# Patient Record
Sex: Female | Born: 1995 | Race: White | Hispanic: No | Smoking: Former smoker
Health system: Southern US, Community
[De-identification: ages and names within clinical notes are randomized; demographics above are authoritative.]

## PROBLEM LIST (undated history)

## (undated) DIAGNOSIS — F419 Anxiety disorder, unspecified: Secondary | ICD-10-CM

## (undated) DIAGNOSIS — Z789 Other specified health status: Secondary | ICD-10-CM

## (undated) HISTORY — PX: TONSILLECTOMY: SUR1361

## (undated) HISTORY — DX: Other specified health status: Z78.9

---
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
## 2013-05-19 ENCOUNTER — Ambulatory Visit: Payer: Self-pay

## 2013-05-19 LAB — URINALYSIS, COMPLETE
Bilirubin,UR: NEGATIVE
Glucose,UR: NEGATIVE mg/dL (ref 0–75)
Ketone: NEGATIVE
Nitrite: NEGATIVE
Ph: 7 (ref 4.5–8.0)
Protein: 30
RBC,UR: >30 /HPF (ref 0–5)
Specific Gravity: 1.015 (ref 1.003–1.030)
WBC UR: >30 /HPF (ref 0–5)

## 2013-05-21 LAB — URINE CULTURE

## 2013-09-07 ENCOUNTER — Emergency Department: Payer: Self-pay | Admitting: Emergency Medicine

## 2013-09-07 LAB — DRUG SCREEN, URINE
Amphetamines, Ur Screen: NEGATIVE (ref ?–1000)
Barbiturates, Ur Screen: NEGATIVE (ref ?–200)
Benzodiazepine, Ur Scrn: NEGATIVE (ref ?–200)
Cannabinoid 50 Ng, Ur ~~LOC~~: POSITIVE (ref ?–50)
Cocaine Metabolite,Ur ~~LOC~~: NEGATIVE (ref ?–300)
MDMA (Ecstasy)Ur Screen: NEGATIVE (ref ?–500)
Methadone, Ur Screen: NEGATIVE (ref ?–300)
Opiate, Ur Screen: NEGATIVE (ref ?–300)
Phencyclidine (PCP) Ur S: NEGATIVE (ref ?–25)
Tricyclic, Ur Screen: NEGATIVE (ref ?–1000)

## 2013-09-07 LAB — BASIC METABOLIC PANEL
Anion Gap: 9 (ref 7–16)
BUN: 16 mg/dL (ref 7–18)
Calcium, Total: 9.5 mg/dL (ref 9.0–10.7)
Chloride: 104 mmol/L (ref 98–107)
Co2: 25 mmol/L (ref 21–32)
Creatinine: 0.67 mg/dL (ref 0.60–1.30)
EGFR (African American): >60
EGFR (Non-African Amer.): >60
Glucose: 83 mg/dL (ref 65–99)
Osmolality: 276 (ref 275–301)
Potassium: 3.3 mmol/L — ABNORMAL LOW (ref 3.5–5.1)
Sodium: 138 mmol/L (ref 136–145)

## 2013-09-07 LAB — ETHANOL
Ethanol %: <0.003 % (ref 0.000–0.080)
Ethanol: <3 mg/dL

## 2013-09-07 LAB — CBC WITH DIFFERENTIAL/PLATELET
Basophil #: 0 10*3/uL (ref 0.0–0.1)
Basophil %: 0.3 %
Eosinophil #: 0.1 10*3/uL (ref 0.0–0.7)
Eosinophil %: 0.7 %
HCT: 41.3 % (ref 35.0–47.0)
HGB: 14.5 g/dL (ref 12.0–16.0)
Lymphocyte #: 2.3 10*3/uL (ref 1.0–3.6)
Lymphocyte %: 22.8 %
MCH: 29.8 pg (ref 26.0–34.0)
MCHC: 35 g/dL (ref 32.0–36.0)
MCV: 85 fL (ref 80–100)
Monocyte #: 0.7 x10 3/mm (ref 0.2–0.9)
Monocyte %: 7.2 %
Neutrophil #: 7 10*3/uL — ABNORMAL HIGH (ref 1.4–6.5)
Neutrophil %: 69 %
Platelet: 201 10*3/uL (ref 150–440)
RBC: 4.85 10*6/uL (ref 3.80–5.20)
RDW: 12.9 % (ref 11.5–14.5)
WBC: 10.2 10*3/uL (ref 3.6–11.0)

## 2013-09-07 LAB — URINALYSIS, COMPLETE
Bilirubin,UR: NEGATIVE
Glucose,UR: NEGATIVE mg/dL (ref 0–75)
Granular Cast: 4
Leukocyte Esterase: NEGATIVE
Nitrite: NEGATIVE
Ph: 6 (ref 4.5–8.0)
Protein: 25
RBC,UR: 1 /HPF (ref 0–5)
Specific Gravity: 1.025 (ref 1.003–1.030)
Squamous Epithelial: 38
WBC UR: 2 /HPF (ref 0–5)

## 2013-09-07 LAB — TSH: Thyroid Stimulating Horm: 0.475 u[IU]/mL

## 2013-11-01 DIAGNOSIS — O99320 Drug use complicating pregnancy, unspecified trimester: Secondary | ICD-10-CM | POA: Diagnosis present

## 2015-05-16 ENCOUNTER — Emergency Department
Admission: EM | Admit: 2015-05-16 | Discharge: 2015-05-16 | Disposition: A | Discharge status: Home / Self Care | Payer: BLUE CROSS/BLUE SHIELD | Attending: Student | Admitting: Student

## 2015-05-16 ENCOUNTER — Emergency Department: Payer: BLUE CROSS/BLUE SHIELD

## 2015-05-16 ENCOUNTER — Encounter: Payer: Self-pay | Admitting: Emergency Medicine

## 2015-05-16 DIAGNOSIS — Z3202 Encounter for pregnancy test, result negative: Secondary | ICD-10-CM | POA: Insufficient documentation

## 2015-05-16 DIAGNOSIS — Z880 Allergy status to penicillin: Secondary | ICD-10-CM | POA: Diagnosis not present

## 2015-05-16 DIAGNOSIS — N760 Acute vaginitis: Secondary | ICD-10-CM | POA: Diagnosis not present

## 2015-05-16 DIAGNOSIS — R1032 Left lower quadrant pain: Secondary | ICD-10-CM

## 2015-05-16 DIAGNOSIS — Z720 Tobacco use: Secondary | ICD-10-CM | POA: Diagnosis not present

## 2015-05-16 DIAGNOSIS — N390 Urinary tract infection, site not specified: Secondary | ICD-10-CM | POA: Insufficient documentation

## 2015-05-16 DIAGNOSIS — R0789 Other chest pain: Secondary | ICD-10-CM | POA: Diagnosis not present

## 2015-05-16 DIAGNOSIS — B9689 Other specified bacterial agents as the cause of diseases classified elsewhere: Secondary | ICD-10-CM

## 2015-05-16 DIAGNOSIS — R102 Pelvic and perineal pain: Secondary | ICD-10-CM | POA: Diagnosis present

## 2015-05-16 LAB — CBC WITH DIFFERENTIAL/PLATELET
Basophils Absolute: 0 10*3/uL (ref 0–0.1)
Basophils Relative: 1 %
Eosinophils Absolute: 0 10*3/uL (ref 0–0.7)
Eosinophils Relative: 0 %
HCT: 39.2 % (ref 35.0–47.0)
Hemoglobin: 13.4 g/dL (ref 12.0–16.0)
Lymphocytes Relative: 29 %
Lymphs Abs: 2.5 10*3/uL (ref 1.0–3.6)
MCH: 29 pg (ref 26.0–34.0)
MCHC: 34.2 g/dL (ref 32.0–36.0)
MCV: 85 fL (ref 80.0–100.0)
Monocytes Absolute: 0.6 10*3/uL (ref 0.2–0.9)
Monocytes Relative: 7 %
Neutro Abs: 5.5 10*3/uL (ref 1.4–6.5)
Neutrophils Relative %: 63 %
Platelets: 169 10*3/uL (ref 150–440)
RBC: 4.61 MIL/uL (ref 3.80–5.20)
RDW: 13.4 % (ref 11.5–14.5)
WBC: 8.7 10*3/uL (ref 3.6–11.0)

## 2015-05-16 LAB — URINALYSIS COMPLETE WITH MICROSCOPIC (ARMC ONLY)
Bacteria, UA: NONE SEEN
Bilirubin Urine: NEGATIVE
Glucose, UA: NEGATIVE mg/dL
Hgb urine dipstick: NEGATIVE
Ketones, ur: NEGATIVE mg/dL
Leukocytes, UA: NEGATIVE
Nitrite: NEGATIVE
Protein, ur: NEGATIVE mg/dL
Specific Gravity, Urine: 1.02 (ref 1.005–1.030)
pH: 7 (ref 5.0–8.0)

## 2015-05-16 LAB — BASIC METABOLIC PANEL
Anion gap: 5 (ref 5–15)
BUN: 10 mg/dL (ref 6–20)
CO2: 30 mmol/L (ref 22–32)
Calcium: 8.6 mg/dL — ABNORMAL LOW (ref 8.9–10.3)
Chloride: 104 mmol/L (ref 101–111)
Creatinine, Ser: 0.81 mg/dL (ref 0.44–1.00)
GFR calc Af Amer: >60 mL/min (ref >60)
GFR calc non Af Amer: >60 mL/min (ref >60)
Glucose, Bld: 93 mg/dL (ref 65–99)
Potassium: 4.2 mmol/L (ref 3.5–5.1)
Sodium: 139 mmol/L (ref 135–145)

## 2015-05-16 LAB — WET PREP, GENITAL
Trich, Wet Prep: NONE SEEN
WBC, Wet Prep HPF POC: NONE SEEN
Yeast Wet Prep HPF POC: NONE SEEN

## 2015-05-16 LAB — CHLAMYDIA/NGC RT PCR (ARMC ONLY)
Chlamydia Tr: NOT DETECTED
N gonorrhoeae: NOT DETECTED

## 2015-05-16 LAB — POCT PREGNANCY, URINE: Preg Test, Ur: NEGATIVE

## 2015-05-16 MED ORDER — NITROFURANTOIN MONOHYD MACRO 100 MG PO CAPS
100.0000 mg | ORAL_CAPSULE | Freq: Two times a day (BID) | ORAL | Status: DC
Start: 2015-05-16 — End: 2017-10-10

## 2015-05-16 MED ORDER — KETOROLAC TROMETHAMINE 60 MG/2ML IM SOLN
60.0000 mg | Freq: Once | INTRAMUSCULAR | Status: AC
  Administered 2015-05-16: 60 mg via INTRAMUSCULAR

## 2015-05-16 MED ORDER — KETOROLAC TROMETHAMINE 10 MG PO TABS: 10.0000 mg | ORAL_TABLET | Freq: Three times a day (TID) | ORAL | Status: DC

## 2015-05-16 MED ORDER — KETOROLAC TROMETHAMINE 60 MG/2ML IM SOLN
INTRAMUSCULAR | Status: AC
  Administered 2015-05-16: 60 mg via INTRAMUSCULAR
  Filled 2015-05-16: qty 2

## 2015-05-16 MED ORDER — METRONIDAZOLE 500 MG PO TABS: 500.0000 mg | ORAL_TABLET | Freq: Two times a day (BID) | ORAL | Status: DC

## 2015-05-16 NOTE — Discharge Instructions (Signed)
Bacterial Vaginosis °Bacterial vaginosis is a vaginal infection that occurs when the normal balance of bacteria in the vagina is disrupted. It results from an overgrowth of certain bacteria. This is the most common vaginal infection in women of childbearing age. Treatment is important to prevent complications, especially in pregnant women, as it can cause a premature delivery. °CAUSES  °Bacterial vaginosis is caused by an increase in harmful bacteria that are normally present in smaller amounts in the vagina. Several different kinds of bacteria can cause bacterial vaginosis. However, the reason that the condition develops is not fully understood. °RISK FACTORS °Certain activities or behaviors can put you at an increased risk of developing bacterial vaginosis, including: °· Having a new sex partner or multiple sex partners. °· Douching. °· Using an intrauterine device (IUD) for contraception. °Women do not get bacterial vaginosis from toilet seats, bedding, swimming pools, or contact with objects around them. °SIGNS AND SYMPTOMS  °Some women with bacterial vaginosis have no signs or symptoms. Common symptoms include: °· Grey vaginal discharge. °· A fishlike odor with discharge, especially after sexual intercourse. °· Itching or burning of the vagina and vulva. °· Burning or pain with urination. °DIAGNOSIS  °Your health care provider will take a medical history and examine the vagina for signs of bacterial vaginosis. A sample of vaginal fluid may be taken. Your health care provider will look at this sample under a microscope to check for bacteria and abnormal cells. A vaginal pH test may also be done.  °TREATMENT  °Bacterial vaginosis may be treated with antibiotic medicines. These may be given in the form of a pill or a vaginal cream. A second round of antibiotics may be prescribed if the condition comes back after treatment.  °HOME CARE INSTRUCTIONS  °· Only take over-the-counter or prescription medicines as  directed by your health care provider. °· If antibiotic medicine was prescribed, take it as directed. Make sure you finish it even if you start to feel better. °· Do not have sex until treatment is completed. °· Tell all sexual partners that you have a vaginal infection. They should see their health care provider and be treated if they have problems, such as a mild rash or itching. °· Practice safe sex by using condoms and only having one sex partner. °SEEK MEDICAL CARE IF:  °· Your symptoms are not improving after 3 days of treatment. °· You have increased discharge or pain. °· You have a fever. °MAKE SURE YOU:  °· Understand these instructions. °· Will watch your condition. °· Will get help right away if you are not doing well or get worse. °FOR MORE INFORMATION  °Centers for Disease Control and Prevention, Division of STD Prevention: www.cdc.gov/std °American Sexual Health Association (ASHA): www.ashastd.org  °Document Released: 12/09/2005 Document Revised: 09/29/2013 Document Reviewed: 07/21/2013 °ExitCare® Patient Information ©2015 [employer]. This information is not intended to replace advice given to you by your health care provider. Make sure you discuss any questions you have with your health care provider. ° °Urinary Tract Infection °A urinary tract infection (UTI) can occur any place along the urinary tract. The tract includes the kidneys, ureters, bladder, and urethra. A type of germ called bacteria often causes a UTI. UTIs are often helped with antibiotic medicine.  °HOME CARE  °· If given, take antibiotics as told by your doctor. Finish them even if you start to feel better. °· Drink enough fluids to keep your pee (urine) clear or pale yellow. °· Avoid tea, drinks with caffeine,   and bubbly (carbonated) drinks.  Pee often. Avoid holding your pee in for a long time.  Pee before and after having sex (intercourse).  Wipe from front to back after you poop (bowel movement) if you are a woman. Use  each tissue only once. GET HELP RIGHT AWAY IF:   You have back pain.  You have lower belly (abdominal) pain.  You have chills.  You feel sick to your stomach (nauseous).  You throw up (vomit).  Your burning or discomfort with peeing does not go away.  You have a fever.  Your symptoms are not better in 3 days. MAKE SURE YOU:   Understand these instructions.  Will watch your condition.  Will get help right away if you are not doing well or get worse. Document Released: 05/27/2008 Document Revised: 09/02/2012 Document Reviewed: 07/09/2012 [facility] Patient Information 2015 [employer], [place]. This information is not intended to replace advice given to you by your health care provider. Make sure you discuss any questions you have with your health care provider.  Take the prescription meds as directed.  Follow-up with your provider for worsening symptoms.  Return to the ED as needed.

## 2015-05-16 NOTE — ED Provider Notes (Signed)
[facility] Emergency Department Provider Note? ____________________________________________ ? Time seen: 1537 ? I have reviewed the triage vital signs and the nursing notes.  ________ HISTORY ? Chief Complaint Rib Injury  HPI  Pamela Marquez is a 19 y.o. female who reports to the ED with complaints of left chest wall and left pelvic pain 3 days. She denies any known trauma, URI symptoms, or GI symptoms. She describes the pain in the chest and stabbing," it is worse with laughing and coughing. She describes the pelvic pain as pulling and intermittent. It is worse with movement. She does admit to some dysuria and dark urine. She denies any nausea, vomiting, diarrhea, fever, chills, or sweats. She has not taken anything indications for pain control since yesterday for symptoms, and denies any anorexia or lack of appetite. She is here today for evaluation as the pain has worsened and now rates her pain an 8 out of 10.  Review of Systems  Constitutional: Negative for fever, chills, sweats. Eyes: Negative for visual changes. ENT: Negative for sore throat. Cardiovascular: Positive for left rib pain. Respiratory: Negative for shortness of breath, persistent cough,  Or congestion. Gastrointestinal: Negative for abdominal pain, vomiting and diarrhea. Musculoskeletal: Negative for back pain. Genitourinary: Positive for left pelvic pain, dysuria. Denies vaginal discharge Skin: Negative for rash. Neurological: Negative for headaches, focal weakness or numbness.  10-point ROS otherwise negative. ____________________________________________  History reviewed. No pertinent past medical history.  There are no active problems to display for this patient. ? History reviewed. No pertinent past surgical history. ? Current Outpatient Rx  Name  Route  Sig  Dispense  Refill  . ketorolac (TORADOL) 10 MG tablet   Oral   Take 1 tablet (10 mg total) by mouth every 8 (eight)  hours.   15 tablet   0   . metroNIDAZOLE (FLAGYL) 500 MG tablet   Oral   Take 1 tablet (500 mg total) by mouth 2 (two) times daily.   14 tablet   0   . nitrofurantoin, macrocrystal-monohydrate, (MACROBID) 100 MG capsule   Oral   Take 1 capsule (100 mg total) by mouth 2 (two) times daily.   14 capsule   0    Allergies Penicillins ? History reviewed. No pertinent family history. ? Social History History  Substance Use Topics  . Smoking status: Current Every Day Smoker  . Smokeless tobacco: Not on file  . Alcohol Use: No   PHYSICAL EXAM:  VITAL SIGNS: ED Triage Vitals  Enc Vitals Group     BP 05/16/15 1507 123/81 mmHg     Pulse Rate 05/16/15 1507 73     Resp 05/16/15 1507 20     Temp 05/16/15 1507 98.1 F (36.7 C)     Temp Source 05/16/15 1507 Oral     SpO2 --      Weight 05/16/15 1507 130 lb (58.968 kg)     Height 05/16/15 1507  (1.651 m)     Head Cir --      Peak Flow --      Pain Score 05/16/15 1507 7     Pain Loc --      Pain Edu? --      Excl. in GC? --    Constitutional: Alert and oriented. Well appearing and in no distress. Eyes: Conjunctivae are normal. PERRL. Normal extraocular movements. ENT   Head: Normocephalic and atraumatic.   Nose: No congestion/rhinnorhea.   Mouth/Throat: Mucous membranes are moist.  Ears: Normal external exam. Canals clear. TMs clear bilaterally.   Neck: Supple. No lymphadenopathy. Cardiovascular: Normal rate, regular rhythm. No murmurs, rubs, or gallops. Respiratory: Normal respiratory effort without tachypnea nor retractions. Breath sounds are clear and equal bilaterally. No wheezes/rales/rhonchi. Gastrointestinal: Soft, nondistended, without rebound, or guarding. Left upper and lower quadrant tenderness to palpation. Positive left CVA tenderness. Normal bowel sounds in all quadrants. No organomegaly. Genitourinary:  Normal external exam. Speculum exam reveals normal cervix closed os. Scant white  vaginal discharge is noted. No adnexal masses palpated, left adnexal tenderness on examination. Musculoskeletal: Normal range of motion in all extremities.  Neurologic:  Normal speech and language. No gross focal neurologic deficits are appreciated.  Skin:  Skin is warm, dry and intact. No rash noted. Psychiatric: Mood and affect are normal. Patient exhibits appropriate insight and judgment. _____ LABS Labs Reviewed  WET PREP, GENITAL - Abnormal; Notable for the following:    Clue Cells Wet Prep HPF POC MODERATE (*)    All other components within normal limits  BASIC METABOLIC PANEL - Abnormal; Notable for the following:    Calcium 8.6 (*)    All other components within normal limits  URINALYSIS COMPLETEWITH MICROSCOPIC ([facility])  - Abnormal; Notable for the following:    Color, Urine YELLOW (*)    APPearance HAZY (*)    Squamous Epithelial / LPF 6-30 (*)    All other components within normal limits  CHLAMYDIA/NGC RT PCR ([facility])   CBC WITH DIFFERENTIAL/PLATELET  POC URINE PREG, ED  POCT PREGNANCY, URINE    ___________ RADIOLOGY  CXR IMPRESSION: No active cardiopulmonary disease.  Transvaginal US IMPRESSION: Normal transabdominal pelvic ultrasound. No pelvic free fluid. A dominant follicle is noted in right ovary. _____________ PROCEDURES ? Toradol 60 mg IM ______________________________________________________ INITIAL IMPRESSION / ASSESSMENT AND PLAN / ED COURSE ? Exam, lab, and radiology results to patient and family.  Exam consistent with acute UTI and vaginitis.  Patient given IM pain control and prescription meds for treatment.  Follow-up instructions given.   Pertinent labs & imaging results that were available during my care of the patient were reviewed by me and considered in my medical decision making (see chart for details). ____________________________________________ FINAL CLINICAL IMPRESSION(S) / ED DIAGNOSES?  Final diagnoses:  Combined  abdominal and pelvic pain, left  UTI (lower urinary tract infection)  BV (bacterial vaginosis)      Lissa HoardJenise V Bacon Seham Gardenhire, PA-C 05/16/15 1922  Gayla DossEryka A Gayle, MD 05/17/15 [phone]

## 2015-05-16 NOTE — ED Notes (Signed)
Reports coughing a lot, now has pain in left ribs worse with coughing

## 2017-09-08 ENCOUNTER — Telehealth: Payer: Self-pay | Admitting: Obstetrics and Gynecology

## 2017-09-08 NOTE — Telephone Encounter (Signed)
Pt coming 10/22 for nexplanon removal and insert with sdj at 4:10

## 2017-09-08 NOTE — Telephone Encounter (Signed)
Noted. Will order to arrive by apt date/time. 

## 2017-10-10 ENCOUNTER — Ambulatory Visit (INDEPENDENT_AMBULATORY_CARE_PROVIDER_SITE_OTHER): Payer: BLUE CROSS/BLUE SHIELD | Admitting: Obstetrics and Gynecology

## 2017-10-10 ENCOUNTER — Encounter: Payer: Self-pay | Admitting: Obstetrics and Gynecology

## 2017-10-10 VITALS — BP 112/64 | HR 91 | Ht 64.0 in | Wt 158.0 lb

## 2017-10-10 DIAGNOSIS — Z1339 Encounter for screening examination for other mental health and behavioral disorders: Secondary | ICD-10-CM | POA: Diagnosis not present

## 2017-10-10 DIAGNOSIS — Z113 Encounter for screening for infections with a predominantly sexual mode of transmission: Secondary | ICD-10-CM

## 2017-10-10 DIAGNOSIS — Z01419 Encounter for gynecological examination (general) (routine) without abnormal findings: Secondary | ICD-10-CM

## 2017-10-10 DIAGNOSIS — Z1331 Encounter for screening for depression: Secondary | ICD-10-CM

## 2017-10-10 DIAGNOSIS — Z124 Encounter for screening for malignant neoplasm of cervix: Secondary | ICD-10-CM | POA: Diagnosis not present

## 2017-10-10 NOTE — Progress Notes (Signed)
Gynecology Annual Exam   PCP: Patient, No Pcp Per  Chief Complaint  Patient presents with  . Gynecologic Exam    STD check  . Vaginitis    History of Present Illness:  Ms. Pamela Marquez is a 21 y.o. G0P0000 who LMP was No LMP recorded. Patient has had an implant., presents today for her annual examination.  Her menses are rare.  She is single partner, contraception - Nexplanon.  Last Pap: 2014  Results were: no abnormalities /neg HPV DNA Positive Hx of STDs: HPV  There is no FH of breast cancer. There is no FH of ovarian cancer. The patient does not do self-breast exams.  Tobacco use: daily. Alcohol use: social drinker Exercise: not active  The patient wears seatbelts: sometimes.   The patient reports that domestic violence in her life is absent.   Past Medical History: denies  Past Surgical History:  Procedure Laterality Date  . TONSILLECTOMY      Prior to Admission medications   Medication Sig Start Date End Date Taking? Authorizing Provider  etonogestrel (NEXPLANON) 68 MG IMPL implant 1 each by Subdermal route once.   Yes [provider]    Allergies  Allergen Reactions  . Penicillins Other (See Comments)    unknown    Gynecologic History: No LMP recorded. Patient has had an implant. History of abnormal pap smear: Yes History of STI: Yes   Obstetric History: G0P0000  Social History   Social History  . Marital status: Single    Spouse name: N/A  . Number of children: N/A  . Years of education: N/A   Occupational History  . Not on file.   Social History Main Topics  . Smoking status: Current Every Day Smoker  . Smokeless tobacco: Never Used  . Alcohol use Yes  . Drug use: Yes    Types: Marijuana, Cocaine  . Sexual activity: Yes    Partners: Male    Birth control/ protection: Implant   Other Topics Concern  . Not on file   Social History Narrative  . No narrative on file    Family History  Problem Relation Age of Onset  .  Breast cancer Other 66  . Colon cancer Other    Review of Systems  Constitutional: Negative.   HENT: Negative.   Eyes: Negative.   Respiratory: Negative.   Cardiovascular: Negative.   Gastrointestinal: Negative.   Genitourinary: Negative.   Musculoskeletal: Negative.   Skin: Negative.   Neurological: Negative.   Psychiatric/Behavioral: Negative.      Physical Exam BP 112/64 (BP Location: Left Arm, Patient Position: Sitting, Cuff Size: Normal)   Pulse 91   Ht 5\' 4"  (1.626 m)   Wt 158 lb (71.7 kg)   BMI 27.12 kg/m    Physical Exam  Constitutional: She is oriented to person, place, and time. She appears well-developed and well-nourished. No distress.  Genitourinary: Vagina normal and uterus normal. Pelvic exam was performed with patient supine. There is no rash, tenderness or lesion on the right labia. There is no rash, tenderness or lesion on the left labia. Vagina exhibits no lesion. No tenderness or bleeding in the vagina. No signs of injury around the vagina. Right adnexum does not display mass, does not display tenderness and does not display fullness. Left adnexum does not display mass and does not display fullness. Cervix does not exhibit motion tenderness, lesion, discharge or polyp.   Uterus is mobile and anteverted. Uterus is not enlarged, tender, exhibiting  a mass or irregular (is regular).  HENT:  Head: Normocephalic and atraumatic.  Eyes: EOM are normal. No scleral icterus.  Neck: Normal range of motion. Neck supple. No thyromegaly present.  Cardiovascular: Normal rate and regular rhythm.  Exam reveals no gallop and no friction rub.   No murmur heard. Pulmonary/Chest: Effort normal and breath sounds normal. No respiratory distress. She has no wheezes. She has no rales.  Abdominal: Soft. Bowel sounds are normal. She exhibits no distension and no mass. There is no tenderness. There is no rebound and no guarding.  Musculoskeletal: Normal range of motion. She exhibits no  edema.  Lymphadenopathy:    She has no cervical adenopathy.  Neurological: She is alert and oriented to person, place, and time. No cranial nerve deficit.  Skin: Skin is warm and dry. No erythema.  Psychiatric: She has a normal mood and affect. Her behavior is normal. Judgment normal.   Female chaperone present for pelvic and breast  portions of the physical exam  Results:  Assessment: 21 y.o. G0P0000 female here for routine annual gynecologic examination  Plan: Problem List Items Addressed This Visit    None    Visit Diagnoses    Women's annual routine gynecological examination    -  Primary   Relevant Orders   IGP, CtNg, rfx Aptima HPV ASCU   Screening for depression       Screening for alcohol problem       Pap smear for cervical cancer screening       Relevant Orders   IGP, CtNg, rfx Aptima HPV ASCU   Screen for STD (sexually transmitted disease)       Relevant Orders   IGP, CtNg, rfx Aptima HPV ASCU      Screening: -- Blood pressure screen normal -- Weight screening: normal -- Depression screening negative (PHQ-9) -- Nutrition: normal -- cholesterol screening: not due for screening -- osteoporosis screening: not due -- tobacco screening: using: discussed quitting using the 5 A's -- alcohol screening: AUDIT questionnaire indicates low-risk usage. -- family history of breast cancer screening: done. not at high risk. -- no evidence of domestic violence or intimate partner violence. -- STD screening: gonorrhea/chlamydia NAAT collected -- pap smear collected per ASCCP guidelines -- HPV vaccination series:   Thomasene MohairStephen Karis Rilling, MD 10/10/2017 4:16 PM

## 2017-10-13 ENCOUNTER — Ambulatory Visit (INDEPENDENT_AMBULATORY_CARE_PROVIDER_SITE_OTHER): Payer: BLUE CROSS/BLUE SHIELD | Admitting: Obstetrics and Gynecology

## 2017-10-13 VITALS — BP 118/74 | Ht 64.0 in | Wt 155.0 lb

## 2017-10-13 DIAGNOSIS — Z3046 Encounter for surveillance of implantable subdermal contraceptive: Secondary | ICD-10-CM

## 2017-10-13 DIAGNOSIS — Z30017 Encounter for initial prescription of implantable subdermal contraceptive: Secondary | ICD-10-CM

## 2017-10-13 LAB — IGP, CTNG, RFX APTIMA HPV ASCU
Chlamydia, Nuc. Acid Amp: NEGATIVE
Gonococcus by Nucleic Acid Amp: NEGATIVE
PAP Smear Comment: 0

## 2017-10-13 MED ORDER — ETONOGESTREL 68 MG ~~LOC~~ IMPL: 1.0000 | DRUG_IMPLANT | Freq: Once | SUBCUTANEOUS | 0 refills | Status: DC

## 2017-10-13 NOTE — Progress Notes (Signed)
  GYNECOLOGY PROCEDURE NOTE  Nexplanon removal discussed in detail.  Risks of infection, bleeding, nerve injury all reviewed.  Patient understands risks and desires to proceed.  Verbal consent obtained.  Patient is certain she wants the Nexplanon removed.  All questions answered.  Procedure: Patient placed in dorsal supine with left arm above head, elbow flexed at 90 degrees, arm resting on examination table.  Nexplanon identified without problems.  Betadine scrub x3.  1 ml of 1% lidocaine injected under Nexplanon device without problems.  Sterile gloves applied.  Small 0.5cm incision made at distal tip of Nexplanon device with 11 blade scalpel.  Nexplanon brought to incision and grasped with a small kelly clamp.  Nexplanon removed intact without problems.    GYNECOLOGY PROCEDURE NOTE  Patient is a 21 y.o. G0P0000 presenting for Nexplanon insertion as her desires means of contraception.  She provided informed consent, signed copy in the chart, time out was performed.   She understands that Nexplanon is a progesterone only therapy, and that patients often patients have irregular and unpredictable vaginal bleeding or amenorrhea. She understands that other side effects are possible related to systemic progesterone, including but not limited to, headaches, breast tenderness, nausea, and irritability. While effective at preventing pregnancy long acting reversible contraceptives do not prevent transmission of sexually transmitted diseases and use of barrier methods for this purpose was discussed. The placement procedure for Nexplanon was reviewed with the patient in detail including risks of nerve injury, infection, bleeding and injury to other muscles or tendons. She understands that the Nexplanon implant is good for 3 years and needs to be removed at the end of that time.  She understands that Nexplanon is an extremely effective option for contraception, with failure rate of <1%. This information is  reviewed today and all questions were answered. Informed consent was obtained, both verbally and written.   The patient is healthy and has no contraindications to Nexplanon use. Urine pregnancy test was performed today and was negative.  Procedure Appropriate time out taken.  The patient was already in the correct position with a skin incision from the removal of her prior Nexplanon.  She was injected with 2 cc of 1% lidocaine without epinephrine.  Nexplanon removed form sterile blister packaging,  Device confirmed in needle, before inserting full length of needle, tenting up the skin as the needle was advance.  The drug eluting rod was then deployed by pulling back the slider per the manufactures recommendation.  The implant was palpable by the clinician as well as the patient.  The insertion site covered dressed with a band aid before applying  a kerlex bandage pressure dressing.  Minimal blood loss was noted during the procedure.  The patient tolerated the procedure well.   She was instructed to wear the bandage for 24 hours, call with any signs of infection.  She was given the Nexplanon card and instructed to have the rod removed in 3 years.  Thomasene MohairStephen Ellianah Cordy, MD 10/13/2017 5:52 PM

## 2017-10-14 ENCOUNTER — Encounter: Payer: Self-pay | Admitting: Obstetrics and Gynecology

## 2017-10-17 ENCOUNTER — Telehealth: Payer: Self-pay | Admitting: Obstetrics and Gynecology

## 2017-10-17 DIAGNOSIS — A5901 Trichomonal vulvovaginitis: Secondary | ICD-10-CM | POA: Insufficient documentation

## 2017-10-17 MED ORDER — METRONIDAZOLE 500 MG PO TABS: 2000.0000 mg | ORAL_TABLET | Freq: Once | ORAL | 0 refills | Status: AC

## 2017-10-17 NOTE — Telephone Encounter (Signed)
Discussed incidental finding of trichomonas on pap smear.  Discussed need for treatment, the medication, and its side effects. Discussed need to have her partner treated and her risk of re-infection, if her partner isn't treated and any partners he may also have.  She voiced understanding and agreement.

## 2018-04-24 ENCOUNTER — Ambulatory Visit: Payer: BLUE CROSS/BLUE SHIELD | Admitting: Advanced Practice Midwife

## 2018-04-27 ENCOUNTER — Ambulatory Visit: Payer: BLUE CROSS/BLUE SHIELD | Admitting: Obstetrics and Gynecology

## 2018-05-03 ENCOUNTER — Encounter: Payer: Self-pay | Admitting: Obstetrics and Gynecology

## 2018-05-04 ENCOUNTER — Encounter: Payer: Self-pay | Admitting: Obstetrics and Gynecology

## 2018-05-04 ENCOUNTER — Telehealth: Payer: Self-pay | Admitting: Obstetrics and Gynecology

## 2018-05-04 NOTE — Telephone Encounter (Signed)
-----   Message from Mychart, Generic sent at 05/03/2018 3:48 PM EDT -----    Appointment Request From: Pamela Marquez    With Provider: Thomasene Mohair, MD [[facility]]    Preferred Date Range: 05/04/2018 - 05/05/2018    Preferred Times: Any time    Reason for visit: Office Visit    Comments:  Remove nexplanon   Attempted to reach patient voicemail box not set up

## 2018-05-06 ENCOUNTER — Telehealth: Payer: Self-pay | Admitting: Obstetrics and Gynecology

## 2018-05-06 NOTE — Telephone Encounter (Signed)
Attempted to reach patient to schedule appt. Voicemail is not set up

## 2020-10-18 ENCOUNTER — Encounter: Payer: Self-pay | Admitting: Family Medicine

## 2020-10-18 ENCOUNTER — Other Ambulatory Visit: Payer: Self-pay

## 2020-10-18 ENCOUNTER — Ambulatory Visit: Payer: Self-pay

## 2020-10-18 ENCOUNTER — Ambulatory Visit (LOCAL_COMMUNITY_HEALTH_CENTER): Payer: Medicaid Other | Admitting: Family Medicine

## 2020-10-18 VITALS — BP 119/79 | Ht 64.0 in | Wt 187.0 lb

## 2020-10-18 DIAGNOSIS — Z539 Procedure and treatment not carried out, unspecified reason: Secondary | ICD-10-CM | POA: Diagnosis not present

## 2020-10-18 DIAGNOSIS — Z3009 Encounter for other general counseling and advice on contraception: Secondary | ICD-10-CM

## 2020-10-18 NOTE — Progress Notes (Signed)
Pt to clinic for nexplanon removal; declines hormonal birth control, desires to use condoms for [facility]. Pt is aware that she is due for physical with [facility].

## 2020-10-18 NOTE — Progress Notes (Signed)
[facility] DEPARTMENT [facility] [address] Main Number: [phone]    Family Planning Visit- Initial Visit  Subjective:  Pamela Marquez is a 24 y.o.  G0P0000   being seen today for an initial well woman visit and to discuss family planning options.  She is currently using Nexplanon for pregnancy prevention. Patient reports she does not want a pregnancy in the next year.  Patient has the following medical conditions has Trichomonas vaginalis (TV) infection on their problem list.  Chief Complaint  Patient presents with  . Annual Exam  . Contraception    Patient reports she is here just for her Nexplanon removal.  States she doesn't plan on having her exams and labwork in our clinic.  She plans to return to [facility] in [place] for her pap, etc.  She states that her Nexplanon was inserted 3 years ago @ [facility].   Client states that she has difficulty feeling her Nexplanon in her L arm.    Patient denies denies concerns  Body mass index is 32.1 kg/m. - Patient is eligible for diabetes screening based on BMI and age >34?  not applicable HA1C ordered? not applicable  Patient reports 1 of partners in last year. Desires STI screening?  No - declined  Has patient been screened once for HCV in the past?  No  No results found for: HCVAB  Does the patient have current drug use (including MJ), have a partner with drug use, and/or has been incarcerated since last result? No  If yes-- Screen for HCV through [facility]   Does the patient meet criteria for HBV testing? No  Criteria:  -Household, sexual or needle sharing contact with HBV -History of drug use -HIV positive -Those with known Hep C   Health Maintenance Due  Topic Date Due  . Hepatitis C Screening  Never done  . COVID-19 Vaccine (1) Never done  . HIV Screening  Never done  . TETANUS/TDAP  Never done  . PAP-Cervical Cytology Screening  Never done  . INFLUENZA VACCINE  Never done  .  PAP SMEAR-Modifier  10/10/2020    ROS  The following portions of the patient's history were reviewed and updated as appropriate: allergies, current medications, past family history, past medical history, past social history, past surgical history and problem list. Problem list updated.   See flowsheet for other program required questions.  Objective:   Vitals:   10/18/20 1458  BP: 119/79  Weight: 187 lb (84.8 kg)  Height: 5\' 4"  (1.626 m)    Physical Exam    Assessment and Plan:  JADAH BOBAK is a 24 y.o. female presenting to the [facility] Department for an initial well woman exam/family planning visit  Contraception counseling: Reviewed all forms of birth control options in the tiered based approach. available including abstinence; over the counter/barrier methods; hormonal contraceptive medication including pill, patch, ring, injection,contraceptive implant, ECP; hormonal and nonhormonal IUDs; permanent sterilization options including vasectomy and the various tubal sterilization modalities. Risks, benefits, and typical effectiveness rates were reviewed.  Questions were answered.  Written information was also given to the patient to review.  Patient desires Nexplanon removal, she plans not to start birth control She will follow up in [facility] for surveillance.  She was told to call with any further questions, or with any concerns about this method of contraception.  Emphasized use of condoms 100% of the time for STI prevention.  Patient was as not an  ECP candidate  1. Family planning Client declines exam, labwork today Client plan to have exam and pap done at [facility] for continuation of her well woman health care.  2. Procedure not carried out Provider attempted to palpate the Nexplanon in client's L arm, was unable to palpate after several attempts.  Co client to return to [facility] for evaluation of placement and removal.  Client agrees to this plan     No follow-ups on  file.  No future appointments.  Larene Pickett, FNP

## 2020-10-20 ENCOUNTER — Other Ambulatory Visit: Payer: Self-pay

## 2020-10-20 ENCOUNTER — Encounter: Payer: Self-pay | Admitting: Obstetrics & Gynecology

## 2020-10-20 ENCOUNTER — Ambulatory Visit (INDEPENDENT_AMBULATORY_CARE_PROVIDER_SITE_OTHER): Payer: Medicaid Other | Admitting: Obstetrics & Gynecology

## 2020-10-20 VITALS — BP 120/80 | Ht 65.0 in | Wt 185.0 lb

## 2020-10-20 DIAGNOSIS — Z3046 Encounter for surveillance of implantable subdermal contraceptive: Secondary | ICD-10-CM | POA: Diagnosis not present

## 2020-10-20 NOTE — Patient Instructions (Signed)
Nexplanon Instructions After Insertion  Keep bandage clean and dry for 24 hours  May use ice/Tylenol/Ibuprofen for soreness or pain  If you develop fever, drainage or increased warmth from incision site-contact office immediately   

## 2020-10-20 NOTE — Progress Notes (Signed)
  Nexplanon removal Procedure note - The Nexplanon was noted in the patient's arm and the end was identified. The skin was cleansed with a Betadine solution. A small injection of subcutaneous lidocaine with epinephrine was given over the end of the implant. An incision was made at the end of the implant. The rod was noted in the incision and grasped with a hemostat. It was noted to be intact.  Steri-Strip was placed approximating the incision. Hemostasis was noted.  Birth Control I discussed multiple birth control options and methods with the patient.  The risks and benefits of each were reviewed.  The possible side effects including deep venous thrombosis, breast tenderness, fluid retention, mood changes and abnormal vaginal bleeding were discussed.  Combination as well as progesterone-only options, pros and cons counseled.  She desires no birth control at this time.  Plan annual w PAP and further discussion of contraception soon.  Letitia Libra ,MD 10/20/2020,8:42 AM

## 2020-11-23 ENCOUNTER — Ambulatory Visit: Payer: Medicaid Other | Admitting: Obstetrics

## 2020-12-12 ENCOUNTER — Ambulatory Visit: Payer: Medicaid Other | Admitting: Obstetrics

## 2022-12-23 NOTE — L&D Delivery Note (Signed)
Delivery Note  Pamela Marquez is a G1P0000 at [redacted]w[redacted]d with an LMP of 12/11/2022, with estimated delivery of 09/17/2023.  First Stage: Labor onset: 1358 Induction: misoprostol Analgesia /Anesthesia intrapartum: epidural  SROM at 1358 GBS: negative IP Antibiotics: None  Second Stage: Complete dilation at 1648 Onset of pushing at 1651 FHR second stage 130 bpm with moderate, variable decels with pushing   [name] presented to L&D with  elevated BP, no prenatal care, and drug use in pregnancy. She was diagnosed with gHTN. She progressed to C/C/+2 with a spontaneous urge to push.  She pushed  effectively over approximately 25 minutes for a spontaneous vaginal birth.  Delivery of a viable baby boy on 09/17/2023 at 1716 by CNM Delivery of fetal head in OA position with restitution to LOA. No nuchal cord;  Anterior then posterior shoulders delivered easily with gentle downward traction. Baby placed on mom's chest, and attended to by baby RN Cord double clamped after cessation of pulsation, cut by father of baby  [name], CNM at bedside/supervised delivery.   Cord blood sample collection: Yes O POS Performed at [facility], [address] [place], [place] [address]  Collection of cord blood donation: No Arterial cord blood sample: No  Third Stage: Oxytocin bolus started after delivery of infant for hemorrhage prophylaxis  Placenta delivered  intact with 3 VC @ 1725 Placenta disposition: Discarded Uterine tone Firm / bleeding moderate  2nd degree perineal laceration identified  Anesthesia for repair: epidural Repair: 2-0 Vicryl CT and 3-0 Vicryl SH Est. Blood Loss (mL): 700  Complications: None  Mom to postpartum.  Baby to Couplet care / Skin to Skin.  Newborn: Information for the patient's newborn:  Amaya, Vangorder [[id number]]  Live born female  Birth Weight:   APGAR: 8, 9  Newborn Delivery   Birth date/time: 09/17/2023 17:16:00 Delivery type: Vaginal, Spontaneous       Feeding planned: formula feeding  ---------- Roney Jaffe CNM Certified Nurse Midwife [facility]  Clinic OB/GYN [facility]

## 2023-02-06 ENCOUNTER — Ambulatory Visit (LOCAL_COMMUNITY_HEALTH_CENTER): Payer: Medicaid Other

## 2023-02-06 VITALS — BP 117/60 | Ht 65.0 in | Wt 178.5 lb

## 2023-02-06 DIAGNOSIS — Z3201 Encounter for pregnancy test, result positive: Secondary | ICD-10-CM

## 2023-02-06 LAB — PREGNANCY, URINE: Preg Test, Ur: POSITIVE — AB

## 2023-02-06 MED ORDER — PRENATAL VITAMINS 28-0.8 MG PO TABS: 28.0000 mg | ORAL_TABLET | Freq: Every day | ORAL | 0 refills | Status: AC

## 2023-02-06 NOTE — Progress Notes (Signed)
UPT positive. FOB with her during visit. Not sure where she plans prenatal care; resource list given and instructed to establish prenatal care soon and pt agreed.  Gave positive pregnancy test packet and reviewed information with pt.  Stressed no ETOH, drugs, or smoking;  literature given.  The patient was dispensed prenatal vitamins today. I provided counseling today regarding the medication.  Patient given the opportunity to ask questions. Questions answered.    Tonny Branch, RN

## 2023-09-16 ENCOUNTER — Encounter: Payer: Self-pay | Admitting: Obstetrics and Gynecology

## 2023-09-16 ENCOUNTER — Other Ambulatory Visit: Payer: Self-pay

## 2023-09-16 ENCOUNTER — Observation Stay: Payer: Medicaid Other

## 2023-09-16 ENCOUNTER — Inpatient Hospital Stay
Admission: EM | Admit: 2023-09-16 | Discharge: 2023-09-19 | DRG: 806 | Disposition: A | Discharge status: Home / Self Care | Payer: Medicaid Other | Attending: Obstetrics | Admitting: Obstetrics

## 2023-09-16 DIAGNOSIS — Z800 Family history of malignant neoplasm of digestive organs: Secondary | ICD-10-CM

## 2023-09-16 DIAGNOSIS — Z3A39 39 weeks gestation of pregnancy: Secondary | ICD-10-CM | POA: Diagnosis not present

## 2023-09-16 DIAGNOSIS — O99330 Smoking (tobacco) complicating pregnancy, unspecified trimester: Secondary | ICD-10-CM | POA: Diagnosis present

## 2023-09-16 DIAGNOSIS — F1490 Cocaine use, unspecified, uncomplicated: Secondary | ICD-10-CM | POA: Diagnosis present

## 2023-09-16 DIAGNOSIS — O99324 Drug use complicating childbirth: Secondary | ICD-10-CM | POA: Diagnosis present

## 2023-09-16 DIAGNOSIS — Z87891 Personal history of nicotine dependence: Secondary | ICD-10-CM | POA: Diagnosis not present

## 2023-09-16 DIAGNOSIS — O134 Gestational [pregnancy-induced] hypertension without significant proteinuria, complicating childbirth: Principal | ICD-10-CM | POA: Diagnosis present

## 2023-09-16 DIAGNOSIS — O1203 Gestational edema, third trimester: Principal | ICD-10-CM | POA: Diagnosis present

## 2023-09-16 DIAGNOSIS — F419 Anxiety disorder, unspecified: Secondary | ICD-10-CM | POA: Diagnosis present

## 2023-09-16 DIAGNOSIS — O99320 Drug use complicating pregnancy, unspecified trimester: Secondary | ICD-10-CM | POA: Diagnosis present

## 2023-09-16 DIAGNOSIS — O9081 Anemia of the puerperium: Secondary | ICD-10-CM | POA: Diagnosis not present

## 2023-09-16 DIAGNOSIS — Z880 Allergy status to penicillin: Secondary | ICD-10-CM | POA: Diagnosis not present

## 2023-09-16 DIAGNOSIS — O139 Gestational [pregnancy-induced] hypertension without significant proteinuria, unspecified trimester: Principal | ICD-10-CM | POA: Diagnosis present

## 2023-09-16 DIAGNOSIS — D62 Acute posthemorrhagic anemia: Secondary | ICD-10-CM | POA: Diagnosis not present

## 2023-09-16 DIAGNOSIS — Z23 Encounter for immunization: Secondary | ICD-10-CM | POA: Diagnosis not present

## 2023-09-16 DIAGNOSIS — Z803 Family history of malignant neoplasm of breast: Secondary | ICD-10-CM

## 2023-09-16 DIAGNOSIS — F1290 Cannabis use, unspecified, uncomplicated: Secondary | ICD-10-CM | POA: Diagnosis present

## 2023-09-16 DIAGNOSIS — R030 Elevated blood-pressure reading, without diagnosis of hypertension: Secondary | ICD-10-CM | POA: Diagnosis present

## 2023-09-16 DIAGNOSIS — O0930 Supervision of pregnancy with insufficient antenatal care, unspecified trimester: Secondary | ICD-10-CM

## 2023-09-16 HISTORY — DX: Anxiety disorder, unspecified: F41.9

## 2023-09-16 LAB — DIFFERENTIAL
Abs Immature Granulocytes: 0.09 10*3/uL — ABNORMAL HIGH (ref 0.00–0.07)
Basophils Absolute: 0 10*3/uL (ref 0.0–0.1)
Basophils Relative: 0 %
Eosinophils Absolute: 0.1 10*3/uL (ref 0.0–0.5)
Eosinophils Relative: 1 %
Immature Granulocytes: 1 %
Lymphocytes Relative: 19 %
Lymphs Abs: 2.3 10*3/uL (ref 0.7–4.0)
Monocytes Absolute: 0.8 10*3/uL (ref 0.1–1.0)
Monocytes Relative: 7 %
Neutro Abs: 8.9 10*3/uL — ABNORMAL HIGH (ref 1.7–7.7)
Neutrophils Relative %: 72 %

## 2023-09-16 LAB — COMPREHENSIVE METABOLIC PANEL
ALT: 16 U/L (ref 0–44)
AST: 20 U/L (ref 15–41)
Albumin: 3 g/dL — ABNORMAL LOW (ref 3.5–5.0)
Alkaline Phosphatase: 237 U/L — ABNORMAL HIGH (ref 38–126)
Anion gap: 12 (ref 5–15)
BUN: 7 mg/dL (ref 6–20)
CO2: 21 mmol/L — ABNORMAL LOW (ref 22–32)
Calcium: 8.7 mg/dL — ABNORMAL LOW (ref 8.9–10.3)
Chloride: 101 mmol/L (ref 98–111)
Creatinine, Ser: 0.4 mg/dL — ABNORMAL LOW (ref 0.44–1.00)
GFR, Estimated: >60 mL/min (ref >60)
Glucose, Bld: 83 mg/dL (ref 70–99)
Potassium: 3.5 mmol/L (ref 3.5–5.1)
Sodium: 134 mmol/L — ABNORMAL LOW (ref 135–145)
Total Bilirubin: 0.7 mg/dL (ref 0.3–1.2)
Total Protein: 6.8 g/dL (ref 6.5–8.1)

## 2023-09-16 LAB — TYPE AND SCREEN
ABO/RH(D): O POS
Antibody Screen: NEGATIVE

## 2023-09-16 LAB — CBC
HCT: 31.5 % — ABNORMAL LOW (ref 36.0–46.0)
Hemoglobin: 10.5 g/dL — ABNORMAL LOW (ref 12.0–15.0)
MCH: 26.4 pg (ref 26.0–34.0)
MCHC: 33.3 g/dL (ref 30.0–36.0)
MCV: 79.1 fL — ABNORMAL LOW (ref 80.0–100.0)
Platelets: 237 10*3/uL (ref 150–400)
RBC: 3.98 MIL/uL (ref 3.87–5.11)
RDW: 13.3 % (ref 11.5–15.5)
WBC: 12.2 10*3/uL — ABNORMAL HIGH (ref 4.0–10.5)
nRBC: 0 % (ref 0.0–0.2)

## 2023-09-16 LAB — URINE DRUG SCREEN, QUALITATIVE (ARMC ONLY)
Amphetamines, Ur Screen: NOT DETECTED
Barbiturates, Ur Screen: NOT DETECTED
Benzodiazepine, Ur Scrn: NOT DETECTED
Cannabinoid 50 Ng, Ur ~~LOC~~: POSITIVE — AB
Cocaine Metabolite,Ur ~~LOC~~: POSITIVE — AB
MDMA (Ecstasy)Ur Screen: NOT DETECTED
Methadone Scn, Ur: NOT DETECTED
Opiate, Ur Screen: NOT DETECTED
Phencyclidine (PCP) Ur S: NOT DETECTED
Tricyclic, Ur Screen: NOT DETECTED

## 2023-09-16 LAB — ABO/RH: ABO/RH(D): O POS

## 2023-09-16 LAB — PROTEIN / CREATININE RATIO, URINE
Creatinine, Urine: 63 mg/dL
Protein Creatinine Ratio: 0.24 mg/mg{Cre} — ABNORMAL HIGH (ref 0.00–0.15)
Total Protein, Urine: 15 mg/dL

## 2023-09-16 LAB — WET PREP, GENITAL
Clue Cells Wet Prep HPF POC: NONE SEEN
Sperm: NONE SEEN
Trich, Wet Prep: NONE SEEN
WBC, Wet Prep HPF POC: >=10 — AB (ref <10)
Yeast Wet Prep HPF POC: NONE SEEN

## 2023-09-16 LAB — RAPID HIV SCREEN (HIV 1/2 AB+AG)
HIV 1/2 Antibodies: NONREACTIVE
HIV-1 P24 Antigen - HIV24: NONREACTIVE

## 2023-09-16 LAB — HEPATITIS B SURFACE ANTIGEN: Hepatitis B Surface Ag: NONREACTIVE

## 2023-09-16 LAB — GROUP B STREP BY PCR: Group B strep by PCR: NEGATIVE

## 2023-09-16 MED ORDER — MISOPROSTOL 25 MCG QUARTER TABLET
25.0000 ug | ORAL_TABLET | ORAL | Status: DC
  Administered 2023-09-17 (×2): 25 ug via ORAL
  Filled 2023-09-16 (×2): qty 1

## 2023-09-16 MED ORDER — LIDOCAINE HCL (PF) 1 % IJ SOLN
30.0000 mL | INTRAMUSCULAR | Status: DC | PRN
  Filled 2023-09-16: qty 30

## 2023-09-16 MED ORDER — MISOPROSTOL 25 MCG QUARTER TABLET
25.0000 ug | ORAL_TABLET | ORAL | Status: DC
  Administered 2023-09-17 (×2): 25 ug via VAGINAL
  Filled 2023-09-16 (×2): qty 1

## 2023-09-16 MED ORDER — AMMONIA AROMATIC IN INHA
RESPIRATORY_TRACT | Status: AC
  Filled 2023-09-16: qty 10

## 2023-09-16 MED ORDER — OXYTOCIN-SODIUM CHLORIDE 30-0.9 UT/500ML-% IV SOLN
2.5000 [IU]/h | INTRAVENOUS | Status: DC
  Filled 2023-09-16: qty 500

## 2023-09-16 MED ORDER — SOD CITRATE-CITRIC ACID 500-334 MG/5ML PO SOLN: 30.0000 mL | ORAL | Status: DC | PRN

## 2023-09-16 MED ORDER — TERBUTALINE SULFATE 1 MG/ML IJ SOLN: 0.2500 mg | Freq: Once | INTRAMUSCULAR | Status: DC | PRN

## 2023-09-16 MED ORDER — ONDANSETRON HCL 4 MG/2ML IJ SOLN
4.0000 mg | Freq: Four times a day (QID) | INTRAMUSCULAR | Status: DC | PRN
  Administered 2023-09-17: 4 mg via INTRAVENOUS
  Filled 2023-09-16: qty 2

## 2023-09-16 MED ORDER — MISOPROSTOL 200 MCG PO TABS
ORAL_TABLET | ORAL | Status: AC
  Filled 2023-09-16: qty 4

## 2023-09-16 MED ORDER — LACTATED RINGERS IV SOLN
500.0000 mL | INTRAVENOUS | Status: DC | PRN
  Administered 2023-09-17: 500 mL via INTRAVENOUS

## 2023-09-16 MED ORDER — OXYTOCIN BOLUS FROM INFUSION
333.0000 mL | Freq: Once | INTRAVENOUS | Status: AC
  Administered 2023-09-17: 333 mL via INTRAVENOUS

## 2023-09-16 MED ORDER — OXYTOCIN 10 UNIT/ML IJ SOLN
INTRAMUSCULAR | Status: AC
  Filled 2023-09-16: qty 2

## 2023-09-16 MED ORDER — FENTANYL CITRATE (PF) 100 MCG/2ML IJ SOLN: 50.0000 ug | INTRAMUSCULAR | Status: DC | PRN

## 2023-09-16 MED ORDER — LACTATED RINGERS IV SOLN: INTRAVENOUS | Status: DC

## 2023-09-16 MED ORDER — ACETAMINOPHEN 325 MG PO TABS: 650.0000 mg | ORAL_TABLET | ORAL | Status: DC | PRN

## 2023-09-16 MED ORDER — CLINDAMYCIN PHOSPHATE 900 MG/50ML IV SOLN
900.0000 mg | Freq: Three times a day (TID) | INTRAVENOUS | Status: DC
  Filled 2023-09-16: qty 50

## 2023-09-16 NOTE — H&P (Addendum)
OB History & Physical   History of Present Illness:   Chief Complaint: swelling to LE  HPI:  Pamela Marquez is a 27 y.o. G1P0000 female at [redacted]w[redacted]d, Patient's last menstrual period was 12/11/2022 (approximate).,  with Estimated Date of Delivery: 09/17/23.  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 Marijuana use in pregnancy Anxiety No PNC Tobacco use in pregnancy  Patient Active Problem List   Diagnosis Date Noted   Leg swelling in pregnancy in third trimester 09/16/2023   Gestational hypertension 09/16/2023   Trichomonas vaginalis (TV) infection 10/17/2017    Prenatal Transfer Tool  Maternal Diabetes: unknown Genetic Screening: unknown Maternal Ultrasounds/Referrals: none Fetal Ultrasounds or other Referrals:  None Maternal Substance Abuse:  Yes:  Type: Smoker, Marijuana, Cocaine Significant Maternal Medications:  None Significant Maternal Lab Results: {Significant Maternal Lab Results:20235}  Maternal Medical History:   Past Medical History:  Diagnosis Date   Patient denies medical problems     Past Surgical History:  Procedure Laterality Date   TONSILLECTOMY      Allergies  Allergen Reactions   Penicillins Anaphylaxis and Hives    Prior to Admission medications   Medication Sig Start Date End Date Taking? Authorizing Provider  etonogestrel (NEXPLANON) 68 MG IMPL implant 1 each (68 mg total) by Subdermal route once. 10/13/17 10/13/17  Conard Novak, MD     Prenatal care site:  none  OB History  Gravida Para Term Preterm AB Living  1 0 0 0 0 0  SAB IAB Ectopic Multiple Live Births  0 0 0 0 0    # Outcome Date GA Lbr Len/2nd Weight Sex Type Anes PTL Lv  1 Current              Social History: She  reports that she has quit smoking. Her smoking use included cigarettes. She has  never used smokeless tobacco. She reports current alcohol use. She reports that she does not currently use drugs after having used the following drugs: Marijuana and Cocaine.  Family History: family history includes Breast cancer (age of onset: 40) in an other family member; Cancer in her maternal grandfather; Colon cancer in an other family member; Heart disease in her paternal grandmother.   Review of Systems: A full review of systems was performed and negative except as noted in the HPI.     Physical Exam:  Vital Signs: BP 130/85   Pulse 83   Temp 98.2 F (36.8 C) (Oral)   Resp 16   LMP 12/11/2022 (Approximate)   General: no acute distress.  HEENT: normocephalic, atraumatic Heart: regular rate & rhythm Lungs: normal respiratory effort Abdomen: soft, gravid, non-tender;   Pelvic:   External: Normal external female genitalia  Cervix: Dilation: Closed /   /      Extremities: non-tender, symmetric, LE edema bilaterally.  DTRs: +2  Neurologic: Alert & oriented x 3.    Results for orders placed or performed during the hospital encounter of 09/16/23 (from the past 24 hour(s))  CBC     Status: Abnormal   Collection Time: 09/16/23  8:28 PM  Result Value Ref Range   WBC 12.2 (H) 4.0 - 10.5 K/uL   RBC 3.98 3.87 - 5.11 MIL/uL   Hemoglobin 10.5 (L) 12.0 - 15.0 g/dL   HCT 98.1 (L) 19.1 - 47.8 %  MCV 79.1 (L) 80.0 - 100.0 fL   MCH 26.4 26.0 - 34.0 pg   MCHC 33.3 30.0 - 36.0 g/dL   RDW 16.1 09.6 - 04.5 %   Platelets 237 150 - 400 K/uL   nRBC 0.0 0.0 - 0.2 %  Differential     Status: Abnormal   Collection Time: 09/16/23  8:28 PM  Result Value Ref Range   Neutrophils Relative % 72 %   Neutro Abs 8.9 (H) 1.7 - 7.7 K/uL   Lymphocytes Relative 19 %   Lymphs Abs 2.3 0.7 - 4.0 K/uL   Monocytes Relative 7 %   Monocytes Absolute 0.8 0.1 - 1.0 K/uL   Eosinophils Relative 1 %   Eosinophils Absolute 0.1 0.0 - 0.5 K/uL   Basophils Relative 0 %   Basophils Absolute 0.0 0.0 - 0.1 K/uL    Immature Granulocytes 1 %   Abs Immature Granulocytes 0.09 (H) 0.00 - 0.07 K/uL  Rapid HIV screen (HIV 1/2 Ab+Ag)     Status: None   Collection Time: 09/16/23  8:28 PM  Result Value Ref Range   HIV-1 P24 Antigen - HIV24 NON REACTIVE NON REACTIVE   HIV 1/2 Antibodies NON REACTIVE NON REACTIVE   Interpretation (HIV Ag Ab)      A non reactive test result means that HIV 1 or HIV 2 antibodies and HIV 1 p24 antigen were not detected in the specimen.  Type and screen [facility]     Status: None   Collection Time: 09/16/23  8:28 PM  Result Value Ref Range   ABO/RH(D) O POS    Antibody Screen NEG    Sample Expiration      09/19/2023,2359 Performed at [facility], [address] [place], [place] [address]   Comprehensive metabolic panel     Status: Abnormal   Collection Time: 09/16/23  8:28 PM  Result Value Ref Range   Sodium 134 (L) 135 - 145 mmol/L   Potassium 3.5 3.5 - 5.1 mmol/L   Chloride 101 98 - 111 mmol/L   CO2 21 (L) 22 - 32 mmol/L   Glucose, Bld 83 70 - 99 mg/dL   BUN 7 6 - 20 mg/dL   Creatinine, Ser 1.91 (L) 0.44 - 1.00 mg/dL   Calcium 8.7 (L) 8.9 - 10.3 mg/dL   Total Protein 6.8 6.5 - 8.1 g/dL   Albumin 3.0 (L) 3.5 - 5.0 g/dL   AST 20 15 - 41 U/L   ALT 16 0 - 44 U/L   Alkaline Phosphatase 237 (H) 38 - 126 U/L   Total Bilirubin 0.7 0.3 - 1.2 mg/dL   GFR, Estimated >47 >82 mL/min   Anion gap 12 5 - 15  Urine Drug Screen, Qualitative ([facility] only)     Status: Abnormal   Collection Time: 09/16/23  8:32 PM  Result Value Ref Range   Tricyclic, Ur Screen NONE DETECTED NONE DETECTED   Amphetamines, Ur Screen NONE DETECTED NONE DETECTED   MDMA (Ecstasy)Ur Screen NONE DETECTED NONE DETECTED   Cocaine Metabolite,Ur [place] POSITIVE (A) NONE DETECTED   Opiate, Ur Screen NONE DETECTED NONE DETECTED   Phencyclidine (PCP) Ur S NONE DETECTED NONE DETECTED   Cannabinoid 50 Ng, Ur [place] POSITIVE (A) NONE DETECTED   Barbiturates, Ur Screen NONE DETECTED NONE  DETECTED   Benzodiazepine, Ur Scrn NONE DETECTED NONE DETECTED   Methadone Scn, Ur NONE DETECTED NONE DETECTED  Protein / creatinine ratio, urine     Status: Abnormal  Collection Time: 09/16/23  8:32 PM  Result Value Ref Range   Creatinine, Urine 63 mg/dL   Total Protein, Urine 15 mg/dL   Protein Creatinine Ratio 0.24 (H) 0.00 - 0.15 mg/mg[Cre]  ABO/Rh     Status: None (Preliminary result)   Collection Time: 09/16/23  9:49 PM  Result Value Ref Range   ABO/RH(D) PENDING   Wet prep, genital     Status: Abnormal   Collection Time: 09/16/23 10:03 PM   Specimen: Vaginal/Rectal  Result Value Ref Range   Yeast Wet Prep HPF POC NONE SEEN NONE SEEN   Trich, Wet Prep NONE SEEN NONE SEEN   Clue Cells Wet Prep HPF POC NONE SEEN NONE SEEN   WBC, Wet Prep HPF POC >=10 (A) <10   Sperm NONE SEEN     Pertinent Results:  Prenatal Labs: Blood type/Rh PENDING   Antibody screen Negative    Rubella pending    Varicella pending   RPR pending     HBsAg pending    Hep C pending   HIV NON REACTIVE (09/24 2028)   GC neg  Chlamydia neg  Genetic screening cfDNA not done  1 hour GTT Not done  3 hour GTT Not done  GBS pending     FHT:  FHR: 120 bpm, variability: moderate,  accelerations:  Present,  decelerations:  Absent Category/reactivity:  Category I UC:   regular, every 1.5-4 minutes   Cephalic by [place] and SVE   US OB Limited  Result Date: 09/16/2023 CLINICAL DATA:  No prenatal care EXAM: LIMITED OBSTETRIC ULTRASOUND COMPARISON:  None Available. FINDINGS: Number of Fetuses: 1 Heart Rate:  126 bpm Movement: Yes Presentation: Cephalic Placental Location: Right lateral Previa: No Amniotic Fluid (Subjective):  Within normal limits. AFI: 16.2 cm BPD: 9.6 cm 39 w  1 d MATERNAL FINDINGS: Cervix:  Unable to assess due to fetal size Uterus/Adnexae: No abnormality visualized. IMPRESSION: Single viable intrauterine pregnancy with estimated sonographic age of 39 weeks 1 day. This exam is performed on  an emergent basis and does not comprehensively evaluate fetal size, dating, or anatomy; follow-up complete OB US should be considered if further fetal assessment is warranted. Electronically Signed   By: Jasmine Pang M.D.   On: 09/16/2023 22:10    Assessment:  Pamela Marquez is a 27 y.o. G1P0000 female at [redacted]w[redacted]d with no prenatal care and drug use in pregnancy.   Plan:  1. Admit to Labor & Delivery - consents reviewed and obtained - Dr. Algis Downs Schermerhorn notified of admission and plan of care   2. Fetal Well being  - Fetal Tracing: cat 1 - Group B Streptococcus ppx *** indicated: GBS {gen pos neg:315643::"negative"} - Presentation: cephalic confirmed by ultrasound   3. Routine OB: - Prenatal labs reviewed, as above - Rh positive - CBC, T&S, RPR on admit - Clear liquid diet , continuous IV fluids  4. Induction of labor  - Contractions monitored with external toco - Pelvis  adequate for trial of labor  - Plan for induction with misoprostol and oxytocin  - Augmentation with oxytocin and AROM as appropriate  - Plan for  continuous fetal monitoring - Maternal pain control as desired; planning IVPM and nitrous oxide - Anticipate vaginal delivery  5. Post Partum Planning: - Infant feeding: formula feeding - Contraception:  Nexplanon - Tdap vaccine:  not given - Flu vaccine:  not given  Chari Manning, CNM 09/16/23 10:48 PM  Chari Manning, CNM Certified Nurse Midwife [facility]  [facility]

## 2023-09-16 NOTE — Progress Notes (Signed)
Patient moved from OBS2 to [facility] for admission to unit.

## 2023-09-16 NOTE — ED Triage Notes (Signed)
Report given to [facility].

## 2023-09-17 ENCOUNTER — Inpatient Hospital Stay: Payer: Medicaid Other | Admitting: Anesthesiology

## 2023-09-17 ENCOUNTER — Encounter: Payer: Self-pay | Admitting: Obstetrics and Gynecology

## 2023-09-17 DIAGNOSIS — O99330 Smoking (tobacco) complicating pregnancy, unspecified trimester: Secondary | ICD-10-CM | POA: Diagnosis present

## 2023-09-17 DIAGNOSIS — O99320 Drug use complicating pregnancy, unspecified trimester: Secondary | ICD-10-CM | POA: Diagnosis present

## 2023-09-17 DIAGNOSIS — O0930 Supervision of pregnancy with insufficient antenatal care, unspecified trimester: Secondary | ICD-10-CM

## 2023-09-17 DIAGNOSIS — F419 Anxiety disorder, unspecified: Secondary | ICD-10-CM | POA: Diagnosis present

## 2023-09-17 DIAGNOSIS — F1490 Cocaine use, unspecified, uncomplicated: Secondary | ICD-10-CM | POA: Diagnosis present

## 2023-09-17 LAB — HEMOGLOBIN A1C
Hgb A1c MFr Bld: 5.5 % (ref 4.8–5.6)
Mean Plasma Glucose: 111.15 mg/dL

## 2023-09-17 LAB — CHLAMYDIA/NGC RT PCR (ARMC ONLY)
Chlamydia Tr: NOT DETECTED
N gonorrhoeae: NOT DETECTED

## 2023-09-17 LAB — RPR: RPR Ser Ql: NONREACTIVE

## 2023-09-17 MED ORDER — SIMETHICONE 80 MG PO CHEW: 80.0000 mg | CHEWABLE_TABLET | ORAL | Status: DC | PRN

## 2023-09-17 MED ORDER — ACETAMINOPHEN 500 MG PO TABS
1000.0000 mg | ORAL_TABLET | Freq: Four times a day (QID) | ORAL | Status: DC
  Administered 2023-09-18 – 2023-09-19 (×6): 1000 mg via ORAL
  Filled 2023-09-17 (×6): qty 2

## 2023-09-17 MED ORDER — EPHEDRINE 5 MG/ML INJ: 10.0000 mg | INTRAVENOUS | Status: DC | PRN

## 2023-09-17 MED ORDER — SODIUM CHLORIDE 0.9% FLUSH
3.0000 mL | Freq: Two times a day (BID) | INTRAVENOUS | Status: DC
  Administered 2023-09-17 – 2023-09-18 (×3): 3 mL via INTRAVENOUS

## 2023-09-17 MED ORDER — FENTANYL-BUPIVACAINE-NACL 0.5-0.125-0.9 MG/250ML-% EP SOLN
12.0000 mL/h | EPIDURAL | Status: DC | PRN
  Administered 2023-09-17: 12 mL/h via EPIDURAL
  Filled 2023-09-17: qty 250

## 2023-09-17 MED ORDER — LACTATED RINGERS IV SOLN
500.0000 mL | Freq: Once | INTRAVENOUS | Status: AC
  Administered 2023-09-17: 500 mL via INTRAVENOUS

## 2023-09-17 MED ORDER — SODIUM CHLORIDE 0.9% FLUSH: 3.0000 mL | INTRAVENOUS | Status: DC | PRN

## 2023-09-17 MED ORDER — DIPHENHYDRAMINE HCL 25 MG PO CAPS: 25.0000 mg | ORAL_CAPSULE | Freq: Four times a day (QID) | ORAL | Status: DC | PRN

## 2023-09-17 MED ORDER — DOCUSATE SODIUM 100 MG PO CAPS
100.0000 mg | ORAL_CAPSULE | Freq: Two times a day (BID) | ORAL | Status: DC
  Administered 2023-09-18 – 2023-09-19 (×3): 100 mg via ORAL
  Filled 2023-09-17 (×3): qty 1

## 2023-09-17 MED ORDER — PHENYLEPHRINE 80 MCG/ML (10ML) SYRINGE FOR IV PUSH (FOR BLOOD PRESSURE SUPPORT): 80.0000 ug | PREFILLED_SYRINGE | INTRAVENOUS | Status: DC | PRN

## 2023-09-17 MED ORDER — FERROUS SULFATE 325 (65 FE) MG PO TABS
325.0000 mg | ORAL_TABLET | Freq: Two times a day (BID) | ORAL | Status: DC
  Administered 2023-09-18 – 2023-09-19 (×3): 325 mg via ORAL
  Filled 2023-09-17 (×3): qty 1

## 2023-09-17 MED ORDER — BENZOCAINE-MENTHOL 20-0.5 % EX AERO
1.0000 | INHALATION_SPRAY | CUTANEOUS | Status: DC | PRN
  Filled 2023-09-17 (×2): qty 56

## 2023-09-17 MED ORDER — WITCH HAZEL-GLYCERIN EX PADS
1.0000 | MEDICATED_PAD | CUTANEOUS | Status: DC | PRN
  Filled 2023-09-17: qty 100

## 2023-09-17 MED ORDER — PRENATAL MULTIVITAMIN CH
1.0000 | ORAL_TABLET | Freq: Every day | ORAL | Status: DC
  Administered 2023-09-18 – 2023-09-19 (×2): 1 via ORAL
  Filled 2023-09-17 (×2): qty 1

## 2023-09-17 MED ORDER — ONDANSETRON HCL 4 MG/2ML IJ SOLN: 4.0000 mg | INTRAMUSCULAR | Status: DC | PRN

## 2023-09-17 MED ORDER — IBUPROFEN 600 MG PO TABS
600.0000 mg | ORAL_TABLET | Freq: Four times a day (QID) | ORAL | Status: DC
  Administered 2023-09-17 – 2023-09-19 (×7): 600 mg via ORAL
  Filled 2023-09-17 (×7): qty 1

## 2023-09-17 MED ORDER — ONDANSETRON HCL 4 MG PO TABS: 4.0000 mg | ORAL_TABLET | ORAL | Status: DC | PRN

## 2023-09-17 MED ORDER — DIBUCAINE (PERIANAL) 1 % EX OINT: 1.0000 | TOPICAL_OINTMENT | CUTANEOUS | Status: DC | PRN

## 2023-09-17 MED ORDER — LACTATED RINGERS AMNIOINFUSION: INTRAVENOUS | Status: DC

## 2023-09-17 MED ORDER — DIPHENHYDRAMINE HCL 50 MG/ML IJ SOLN: 12.5000 mg | INTRAMUSCULAR | Status: DC | PRN

## 2023-09-17 MED ORDER — BUPIVACAINE HCL (PF) 0.25 % IJ SOLN
INTRAMUSCULAR | Status: DC | PRN
  Administered 2023-09-17: 8 mL via EPIDURAL

## 2023-09-17 MED ORDER — SODIUM CHLORIDE 0.9 % IV SOLN
250.0000 mL | INTRAVENOUS | Status: DC | PRN
  Administered 2023-09-18: 250 mL via INTRAVENOUS

## 2023-09-17 MED ORDER — LIDOCAINE-EPINEPHRINE (PF) 1.5 %-1:200000 IJ SOLN
INTRAMUSCULAR | Status: DC | PRN
  Administered 2023-09-17: 3 mL via EPIDURAL

## 2023-09-17 MED ORDER — COCONUT OIL OIL: 1.0000 | TOPICAL_OIL | Status: DC | PRN

## 2023-09-17 NOTE — Progress Notes (Addendum)
L&D Note    Subjective:  She is feeling her contractions more and requested an epidural.   Objective:   Vitals:   09/17/23 0410 09/17/23 0634 09/17/23 0720 09/17/23 1128  BP: 139/81 122/61 130/62 (!) 140/62  Pulse: 79 65 70 70  Resp:   18   Temp: 98 F (36.7 C)  98.3 F (36.8 C) 98.3 F (36.8 C)  TempSrc: Axillary  Oral Oral  Weight:      Height:        Current Vital Signs 24h Vital Sign Ranges  T 98.3 F (36.8 C) Temp  Avg: 98.2 F (36.8 C)  Min: 98 F (36.7 C)  Max: 98.3 F (36.8 C)  BP (!) 140/62 BP  Min: 122/61  Max: 145/70  HR 70 Pulse  Avg: 92.9  Min: 65  Max: 164  RR 18 Resp  Avg: 17  Min: 16  Max: 18  SaO2     No data recorded      Gen: alert, cooperative, no distress FHR: Baseline: 135 bpm, Variability: moderate, Accels: Present, Decels: none Toco: regular, every 2-3 minutes SVE: Dilation: 1 Effacement (%): 50 Station: -2 Exam by:: Athanasios Heldman CNM  Medications SCHEDULED MEDICATIONS   misoprostol  25 mcg Oral Q4H   And   misoprostol  25 mcg Vaginal Q4H   oxytocin 40 units in LR 1000 mL  333 mL Intravenous Once    MEDICATION INFUSIONS   clindamycin (CLEOCIN) IV     lactated ringers 500 mL (09/17/23 1045)   lactated ringers 125 mL/hr at 09/16/23 2327   oxytocin      PRN MEDICATIONS  acetaminophen, fentaNYL (SUBLIMAZE) injection, lactated ringers, lidocaine (PF), ondansetron, sodium citrate-citric acid, terbutaline   Assessment & Plan:  27 y.o. G1P0000 at [redacted]w[redacted]d admitted for gHTN.  -Blood pressures range from 140-108/61-96 since 0720.  -Labor: Early latent labor. -Fetal Well-being: Category I -GBS: negative -Membranes intact -Continue present management and Augmentation: IV Pitocin augmentation. -Analgesia:  placement of epidural in progress   Azie Mcconahy, CNM  09/17/2023 12:12 PM  [facility]

## 2023-09-17 NOTE — Progress Notes (Signed)
L&D Note    Subjective:  She is resting comfortably in bed. Denies headaches, visual changes, and RUQ pain.   Objective:   Vitals:   09/17/23 1301 09/17/23 1358 09/17/23 1405 09/17/23 1410  BP: (!) 132/96 (!) 116/100    Pulse: 90 80    Resp:      Temp:      TempSrc:      SpO2:  99% 99% 98%  Weight:      Height:        Current Vital Signs 24h Vital Sign Ranges  T 98.3 F (36.8 C) Temp  Avg: 98.2 F (36.8 C)  Min: 98 F (36.7 C)  Max: 98.3 F (36.8 C)  BP (!) 116/100 BP  Min: 108/71  Max: 145/70  HR 80 Pulse  Avg: 87.3  Min: 65  Max: 164  RR 18 Resp  Avg: 17  Min: 16  Max: 18  SaO2 98 %   SpO2  Avg: 98.7 %  Min: 98 %  Max: 100 %      Gen: alert, cooperative, no distress FHR: Baseline: 140 bpm, Variability: moderate, Accels: Present, Decels: variable Toco: regular, every 1-2 minutes SVE: Dilation: 1.5 Effacement (%): 90 Station: -1 Presentation: Vertex Exam by:: A Sherlon Handing RN  Medications SCHEDULED MEDICATIONS   misoprostol  25 mcg Oral Q4H   And   misoprostol  25 mcg Vaginal Q4H   oxytocin 40 units in LR 1000 mL  333 mL Intravenous Once    MEDICATION INFUSIONS   clindamycin (CLEOCIN) IV     fentaNYL 2 mcg/mL w/bupivacaine 0.125% in NS 250 mL 12 mL/hr (09/17/23 1247)   lactated ringers 500 mL (09/17/23 1045)   lactated ringers 125 mL/hr at 09/16/23 2327   oxytocin      PRN MEDICATIONS  acetaminophen, diphenhydrAMINE, ePHEDrine, ePHEDrine, fentaNYL (SUBLIMAZE) injection, fentaNYL 2 mcg/mL w/bupivacaine 0.125% in NS 250 mL, lactated ringers, lidocaine (PF), ondansetron, phenylephrine, phenylephrine, sodium citrate-citric acid, terbutaline   Assessment & Plan:  27 y.o. G1P0000 at [redacted]w[redacted]d admitted for gHTN. No edema noted to lower extremities.  - Mild range BP during current admission -Labor: Early latent labor and status post cervical ripening with Cytotec. Recent SROM and will consider starting IV Pitocin as indicated.  -Fetal Well-being: Category II; overall  reassuring with moderate variability  -GBS: negative -Membranes SROM @ 1358 -Intervention: IV fluid bolus, change maternal position, and place IUPC. Amnioinfusion ordered.  -Analgesia: regional anesthesia   Tomeca Helm, CNM  09/17/2023 2:22 PM  [facility]

## 2023-09-17 NOTE — Anesthesia Preprocedure Evaluation (Addendum)
Anesthesia Evaluation  Patient identified by MRN, date of birth, ID band Patient awake    Reviewed: Allergy & Precautions, NPO status , Patient's Chart, lab work & pertinent test results  History of Anesthesia Complications Negative for: history of anesthetic complications  Airway Mallampati: II   Neck ROM: Full    Dental  (+) Missing   Pulmonary Current Smoker   Pulmonary exam normal breath sounds clear to auscultation       Cardiovascular hypertension (gestational), Normal cardiovascular exam Rhythm:Regular Rate:Normal     Neuro/Psych   Anxiety     Cocaine and marijuana use; states last cocaine use was 3 months ago; does not appear acutely intoxicated    GI/Hepatic negative GI ROS,,,  Endo/Other  negative endocrine ROS    Renal/GU negative Renal ROS     Musculoskeletal   Abdominal   Peds  Hematology negative hematology ROS (+)   Anesthesia Other Findings 27 yo G1P0 at 40 weeks with gHTN; cocaine, marijuana, and tobacco use disorder; anxiety; no prenatal care requesting labor epidural.  Reproductive/Obstetrics (+) Pregnancy                             Anesthesia Physical Anesthesia Plan  ASA: 3  Anesthesia Plan: Epidural   Post-op Pain Management:    Induction:   PONV Risk Score and Plan: 2 and Treatment may vary due to age or medical condition  Airway Management Planned: Natural Airway  Additional Equipment:   Intra-op Plan:   Post-operative Plan:   Informed Consent: I have reviewed the patients History and Physical, chart, labs and discussed the procedure including the risks, benefits and alternatives for the proposed anesthesia with the patient or authorized representative who has indicated his/her understanding and acceptance.     Dental Advisory Given  Plan Discussed with:   Anesthesia Plan Comments: (Patient reports no bleeding problems and no anticoagulant  use.   Patient consented for risks of anesthesia including but not limited to:  - adverse reactions to medications - risk of bleeding, infection and or nerve damage from epidural that could lead to paralysis - risk of headache or failed epidural - nerve damage due to positioning - that if epidural is used for C-section that there is a chance of epidural failure requiring spinal placement or conversion to GA - damage to heart, brain, lungs, other parts of body or loss of life  Patient voiced understanding.)        Anesthesia Quick Evaluation

## 2023-09-17 NOTE — Progress Notes (Signed)
L&D Note    Subjective:  Patient feeling contractions occasionally. She describes them as "mild cramping".   Objective:   Vitals:   09/16/23 2323 09/17/23 0410 09/17/23 0634 09/17/23 0720  BP:  139/81 122/61 130/62  Pulse:  79 65 70  Resp:    18  Temp:  98 F (36.7 C)  98.3 F (36.8 C)  TempSrc:  Axillary  Oral  Weight: 74.8 kg     Height: 5\' 5"  (1.651 m)       Current Vital Signs 24h Vital Sign Ranges  T 98.3 F (36.8 C) Temp  Avg: 98.2 F (36.8 C)  Min: 98 F (36.7 C)  Max: 98.3 F (36.8 C)  BP 130/62 BP  Min: 122/61  Max: 145/70  HR 70 Pulse  Avg: 95.2  Min: 65  Max: 164  RR 18 Resp  Avg: 17  Min: 16  Max: 18  SaO2     No data recorded      Gen: alert, cooperative, no distress FHR: Baseline: 125 bpm, Variability: moderate, Accels: Present, Decels: none Toco: irregular  SVE: Dilation: 1 Effacement (%): 50 Station: -2 Exam by:: Jacqulene Huntley CNM  Medications SCHEDULED MEDICATIONS   ammonia       misoprostol       misoprostol  25 mcg Oral Q4H   And   misoprostol  25 mcg Vaginal Q4H   oxytocin       oxytocin 40 units in LR 1000 mL  333 mL Intravenous Once    MEDICATION INFUSIONS   clindamycin (CLEOCIN) IV     lactated ringers     lactated ringers 125 mL/hr at 09/16/23 2327   oxytocin      PRN MEDICATIONS  acetaminophen, ammonia, fentaNYL (SUBLIMAZE) injection, lactated ringers, lidocaine (PF), misoprostol, ondansetron, oxytocin, sodium citrate-citric acid, terbutaline   Assessment & Plan:  27 y.o. G1P0000 at [redacted]w[redacted]d admitted for gHTN.  -Labor: Early latent labor. -Fetal Well-being: Category I -GBS: negative -Membranes intact -Augmentation: vaginal/oral Cytotec. -Analgesia: IVPM   Drey Shaff, CNM  09/17/2023 8:31 AM  [facility]

## 2023-09-17 NOTE — Anesthesia Procedure Notes (Signed)
Epidural Patient location during procedure: OB Start time: 09/17/2023 12:32 PM End time: 09/17/2023 12:41 PM  Staffing Anesthesiologist: Reed Breech, MD Performed: anesthesiologist   Preanesthetic Checklist Completed: patient identified, IV checked, risks and benefits discussed, surgical consent, monitors and equipment checked, pre-op evaluation and timeout performed  Epidural Patient position: sitting Prep: Betadine Patient monitoring: heart rate, continuous pulse ox and blood pressure Approach: midline Location: L3-L4 Injection technique: LOR air  Needle:  Needle type: Tuohy  Needle gauge: 17 G Needle length: 9 cm Needle insertion depth: 4.5 cm Catheter at skin depth: 9.5 cm Test dose: negative and 1.5% lidocaine with Epi 1:200 K  Assessment Sensory level: T4  Additional Notes Straightforward placement without apparent complications. Reason for block:procedure for pain

## 2023-09-17 NOTE — Progress Notes (Signed)
Patient transported to Ultrasound 

## 2023-09-17 NOTE — Progress Notes (Signed)
L&D Note    Subjective:  has no unusual complaints  Objective:   Vitals:   09/16/23 2323 09/17/23 0410 09/17/23 0634 09/17/23 0720  BP:  139/81 122/61 130/62  Pulse:  79 65 70  Resp:    18  Temp:  98 F (36.7 C)  98.3 F (36.8 C)  TempSrc:  Axillary  Oral  Weight: 74.8 kg     Height: 5\' 5"  (1.651 m)       Current Vital Signs 24h Vital Sign Ranges  T 98.3 F (36.8 C) Temp  Avg: 98.2 F (36.8 C)  Min: 98 F (36.7 C)  Max: 98.3 F (36.8 C)  BP 130/62 BP  Min: 122/61  Max: 145/70  HR 70 Pulse  Avg: 95.2  Min: 65  Max: 164  RR 18 Resp  Avg: 17  Min: 16  Max: 18  SaO2     No data recorded      Gen: alert, cooperative, no distress FHR: Baseline: 125 bpm, Variability: moderate, Accels: Present, Decels: none Toco: regular, every 1-2 minutes SVE:1/50/-2  Medications SCHEDULED MEDICATIONS   ammonia       misoprostol       misoprostol  25 mcg Oral Q4H   And   misoprostol  25 mcg Vaginal Q4H   oxytocin       oxytocin 40 units in LR 1000 mL  333 mL Intravenous Once    MEDICATION INFUSIONS   clindamycin (CLEOCIN) IV     lactated ringers     lactated ringers 125 mL/hr at 09/16/23 2327   oxytocin      PRN MEDICATIONS  acetaminophen, ammonia, fentaNYL (SUBLIMAZE) injection, lactated ringers, lidocaine (PF), misoprostol, ondansetron, oxytocin, sodium citrate-citric acid, terbutaline   Assessment & Plan:  27 y.o. G1P0000 at [redacted]w[redacted]d admitted for [facility], cocaine use -Labor: Active phase labor. and Satisfactory labor progress. -Fetal Well-being: Category I -GBS: negative -Membranes intact -Continue present management., Anticipate vaginal delivery., and Intervention: vaginal/cervical Prostin -Analgesia: IVPM and nitrous oxide   Chari Manning, CNM  09/17/2023 8:42 AM  [facility]

## 2023-09-17 NOTE — Progress Notes (Signed)
L&D Note    Subjective:  Patient has no unusual complaints and feels an increase of pressure.   Objective:   Vitals:   09/17/23 1600 09/17/23 1605 09/17/23 1608 09/17/23 1615  BP: (!) 129/91     Pulse: 95     Resp:      Temp:      TempSrc:      SpO2: 99% 99% 99% 99%  Weight:      Height:        Current Vital Signs 24h Vital Sign Ranges  T 98.5 F (36.9 C) Temp  Avg: 98.3 F (36.8 C)  Min: 98 F (36.7 C)  Max: 98.5 F (36.9 C)  BP (!) 129/91 BP  Min: 104/57  Max: 146/89  HR 95 Pulse  Avg: 87.3  Min: 65  Max: 164  RR 18 Resp  Avg: 17  Min: 16  Max: 18  SaO2 99 %   SpO2  Avg: 98.8 %  Min: 98 %  Max: 100 %      Gen: alert, cooperative, no distress FHR: Baseline: 130 bpm, Variability: moderate, Accels: Present, Decels: variable Toco: regular, every 1-2 minutes SVE: Dilation: 10 Dilation Complete Date: 09/17/23 Dilation Complete Time: 1648 Effacement (%): 100 Station: Plus 2 Presentation: Vertex Exam by:: Maleka Contino CNM  Medications SCHEDULED MEDICATIONS   misoprostol  25 mcg Oral Q4H   And   misoprostol  25 mcg Vaginal Q4H   oxytocin 40 units in LR 1000 mL  333 mL Intravenous Once    MEDICATION INFUSIONS   clindamycin (CLEOCIN) IV     fentaNYL 2 mcg/mL w/bupivacaine 0.125% in NS 250 mL 10 mL/hr (09/17/23 1654)   lactated ringers 100 mL/hr at 09/17/23 1439   lactated ringers 500 mL (09/17/23 1045)   lactated ringers 125 mL/hr at 09/16/23 2327   oxytocin      PRN MEDICATIONS  acetaminophen, diphenhydrAMINE, ePHEDrine, ePHEDrine, fentaNYL (SUBLIMAZE) injection, fentaNYL 2 mcg/mL w/bupivacaine 0.125% in NS 250 mL, lactated ringers, lidocaine (PF), ondansetron, phenylephrine, phenylephrine, sodium citrate-citric acid, terbutaline   Assessment & Plan:  27 y.o. G1P0000 at [redacted]w[redacted]d admitted for gHTN - BP continue to range from WNL to mild range -Labor: Active phase labor. -Fetal Well-being: Category II, reassuring with moderate variability  -GBS: negative -Membranes  ruptured, clear fluid -Continue present management. and Anticipate vaginal delivery. -Analgesia: regional anesthesia - Dr. Feliberto Gottron updated on patient progress    Roney Jaffe, CNM  09/17/2023 5:05 PM  [facility]

## 2023-09-18 LAB — CBC
HCT: 24.5 % — ABNORMAL LOW (ref 36.0–46.0)
Hemoglobin: 8 g/dL — ABNORMAL LOW (ref 12.0–15.0)
MCH: 26.5 pg (ref 26.0–34.0)
MCHC: 32.7 g/dL (ref 30.0–36.0)
MCV: 81.1 fL (ref 80.0–100.0)
Platelets: 186 10*3/uL (ref 150–400)
RBC: 3.02 MIL/uL — ABNORMAL LOW (ref 3.87–5.11)
RDW: 13.3 % (ref 11.5–15.5)
WBC: 15.7 10*3/uL — ABNORMAL HIGH (ref 4.0–10.5)
nRBC: 0 % (ref 0.0–0.2)

## 2023-09-18 LAB — VARICELLA ZOSTER ANTIBODY, IGG: Varicella IgG: REACTIVE

## 2023-09-18 LAB — RUBELLA SCREEN: Rubella: 1.43 index (ref >0.99)

## 2023-09-18 MED ORDER — SODIUM CHLORIDE 0.9 % IV SOLN
300.0000 mg | Freq: Once | INTRAVENOUS | Status: AC
  Administered 2023-09-18: 300 mg via INTRAVENOUS
  Filled 2023-09-18: qty 300

## 2023-09-18 NOTE — Clinical Social Work Maternal (Signed)
CLINICAL SOCIAL WORK MATERNAL/CHILD NOTE  Patient Details  Name: Pamela Marquez MRN: [id number] Date of Birth: August 22, 1996  Date:  09/18/2023  Clinical Social Worker Initiating Note:  Darolyn Rua Date/Time: Initiated:  09/18/23/1000     Child's Name:  Pamela Marquez   Biological Parents:  Mother, Father   Need for Interpreter:  None   Reason for Referral:   (cocaine, marijuana use, living situation)   Address:  [address] [place] [address]    Phone number:  [phone] (home)     Additional phone number:   Household Members/Support Persons (HM/SP):   Household Member/Support Person 1   HM/SP Name Relationship DOB or Age  HM/SP -1 Pamela Marquez FOB    HM/SP -2        HM/SP -3        HM/SP -4        HM/SP -5        HM/SP -6        HM/SP -7        HM/SP -8          Natural Supports (not living in the home):  Extended Family, Immediate Family   Professional Supports:     Employment: Unemployed   Type of Work: mom is unemployed, FOB works as a travel Geneticist, molecular:      Homebound arranged:    Surveyor, quantity Resources:  Medicaid   Other Resources:  [facility]   Cultural/Religious Considerations Which May Impact Care:    Strengths:  Ability to meet basic needs     Psychotropic Medications:         Pediatrician:       Pediatrician List:   [employer]    [place]    [facility]      Pediatrician Fax Number: [facility]  Risk Factors/Current Problems:  Substance Use     Cognitive State:  Alert     Mood/Affect:  Calm     CSW Assessment:   TOC consult for Substance abuse including cocaine and marijuana use, patient cocaine positive 9/24. Patient confirms address in chart as accurate that is her moms home, also receive support from [name]. [name], [name], FOB has a 29 y/o with split custody as well. Patient reports that they are  going  today to [facility] apts in [place] to apply for their own space. She reports they have a crib, car seat, diapers and clothes at mother's home. She reports she is unemployed and FOB is a travel Corporate investment banker, in the past few months they have been to [place], [place] and [place]. She reports that her last cocaine and marijuana use was 3 months ago. Aware that CPS report will be called per hospital protocol as patient continues to test positive for cocaine. She reports choosing [facility] for pediatrician for Pamela Marquez, states no discharge needs at this time.   CPS report has been made for follow up.    CSW Plan/Description:  Child Protective Service Report      Darolyn Rua, [place] 09/18/2023, 10:37 AM

## 2023-09-18 NOTE — Anesthesia Postprocedure Evaluation (Signed)
Anesthesia Post Note  Patient: Pamela Marquez  Procedure(s) Performed: AN AD HOC LABOR EPIDURAL  Patient location during evaluation: Mother Baby Anesthesia Type: Epidural Level of consciousness: awake and alert Pain management: pain level controlled Vital Signs Assessment: post-procedure vital signs reviewed and stable Respiratory status: spontaneous breathing, nonlabored ventilation and respiratory function stable Cardiovascular status: stable Postop Assessment: no headache, no backache and epidural receding Anesthetic complications: no  No notable events documented.   Last Vitals:  Vitals:   09/17/23 2245 09/18/23 0413  BP: 132/82 125/70  Pulse: (!) 104 95  Resp: 18 18  Temp: 36.7 C 36.6 C  SpO2: 100% 98%    Last Pain:  Vitals:   09/18/23 0413  TempSrc: Oral  PainSc:                  Elmarie Mainland

## 2023-09-18 NOTE — Progress Notes (Signed)
Post Partum Day 1  Subjective: Doing well, no concerns. Ambulating without difficulty, pain managed with PO meds, tolerating regular diet, and voiding without difficulty.   No fever/chills, chest pain, shortness of breath, nausea/vomiting, or leg pain. No nipple or breast pain. No headache, visual changes, or RUQ/epigastric pain.  Objective: BP 123/82 (BP Location: Right Arm)   Pulse 90   Temp 98.3 F (36.8 C) (Oral)   Resp 20   Ht 5\' 5"  (1.651 m)   Wt 74.8 kg   LMP 12/11/2022 (Approximate)   SpO2 100%   Breastfeeding Unknown   BMI 27.46 kg/m    Physical Exam:  General: alert and cooperative Breasts: soft/nontender CV: RRR Pulm: nl effort Abdomen: soft, non-tender Uterine Fundus: firm Incision: n/a Perineum: minimal edema, repair well approximated Lochia: appropriate DVT Evaluation: No evidence of DVT seen on physical exam. Edinburgh:     09/18/2023    8:30 AM  Inocente Salles Postnatal Depression Scale Screening Tool  I have been able to laugh and see the funny side of things. 0  I have looked forward with enjoyment to things. 0  I have blamed myself unnecessarily when things went wrong. 1  I have been anxious or worried for no good reason. 0  I have felt scared or panicky for no good reason. 0  Things have been getting on top of me. 1  I have been so unhappy that I have had difficulty sleeping. 0  I have felt sad or miserable. 0  I have been so unhappy that I have been crying. 0  The thought of harming myself has occurred to me. 0  Edinburgh Postnatal Depression Scale Total 2     Recent Labs    09/16/23 2028 09/18/23 0623  HGB 10.5* 8.0*  HCT 31.5* 24.5*  WBC 12.2* 15.7*  PLT 237 186    Assessment/Plan: 27 y.o. G1P1001 postpartum day # 1  1. Continue routine postpartum care  2. Infant feeding status: formula feeding  3. Contraception plan:  TBD  4. Acute blood loss anemia - clinically significant.  -Hemodynamically stable and  asymptomatic -Intervention: IV iron transfusion with venofer ordered   5. Immunization status:   needs TDap prior to discharge    Disposition: Continue inpatient postpartum care    LOS: 2 days   Estefanie Cornforth, CNM 09/18/2023, 5:24 PM

## 2023-09-19 MED ORDER — ACETAMINOPHEN 500 MG PO TABS: 1000.0000 mg | ORAL_TABLET | Freq: Four times a day (QID) | ORAL | Status: AC

## 2023-09-19 MED ORDER — DOCUSATE SODIUM 100 MG PO CAPS: 100.0000 mg | ORAL_CAPSULE | Freq: Two times a day (BID) | ORAL | Status: AC

## 2023-09-19 MED ORDER — WITCH HAZEL-GLYCERIN EX PADS: 1.0000 | MEDICATED_PAD | CUTANEOUS | Status: AC | PRN

## 2023-09-19 MED ORDER — SIMETHICONE 80 MG PO CHEW: 80.0000 mg | CHEWABLE_TABLET | ORAL | Status: AC | PRN

## 2023-09-19 MED ORDER — TETANUS-DIPHTH-ACELL PERTUSSIS 5-2.5-18.5 LF-MCG/0.5 IM SUSY
0.5000 mL | PREFILLED_SYRINGE | Freq: Once | INTRAMUSCULAR | Status: AC
  Administered 2023-09-19: 0.5 mL via INTRAMUSCULAR
  Filled 2023-09-19: qty 0.5

## 2023-09-19 MED ORDER — IBUPROFEN 600 MG PO TABS: 600.0000 mg | ORAL_TABLET | Freq: Four times a day (QID) | ORAL | 0 refills | Status: AC

## 2023-09-19 MED ORDER — VARICELLA VIRUS VACCINE LIVE 1350 PFU/0.5ML IJ SUSR
0.5000 mL | INTRAMUSCULAR | Status: AC | PRN
  Administered 2023-09-19: 0.5 mL via SUBCUTANEOUS
  Filled 2023-09-19 (×2): qty 0.5

## 2023-09-19 MED ORDER — FERROUS SULFATE 325 (65 FE) MG PO TABS: 325.0000 mg | ORAL_TABLET | Freq: Two times a day (BID) | ORAL | Status: AC

## 2023-09-19 MED ORDER — COCONUT OIL OIL: 1.0000 | TOPICAL_OIL | Status: AC | PRN

## 2023-09-19 MED ORDER — BENZOCAINE-MENTHOL 20-0.5 % EX AERO: 1.0000 | INHALATION_SPRAY | CUTANEOUS | Status: AC | PRN

## 2023-09-19 MED ORDER — DIBUCAINE (PERIANAL) 1 % EX OINT: 1.0000 | TOPICAL_OINTMENT | CUTANEOUS | Status: AC | PRN

## 2023-09-19 MED ORDER — INFLUENZA VIRUS VACC SPLIT PF (FLUZONE) 0.5 ML IM SUSY
0.5000 mL | PREFILLED_SYRINGE | Freq: Once | INTRAMUSCULAR | Status: AC
  Administered 2023-09-19: 0.5 mL via INTRAMUSCULAR
  Filled 2023-09-19: qty 0.5

## 2023-09-19 NOTE — Progress Notes (Signed)
Patient discharged home with family.  Discharge instructions, when to follow up, and prescriptions reviewed with patient.  Patient verbalized understanding. Patient will be escorted out by auxiliary.   

## 2023-09-19 NOTE — Discharge Instructions (Signed)
